# Patient Record
Sex: Male | Born: 1985 | Race: Black or African American | Hispanic: No | Marital: Married | Smoking: Current every day smoker
Health system: Southern US, Community
[De-identification: ages and names within clinical notes are randomized; demographics above are authoritative.]

---

## 2005-11-14 ENCOUNTER — Emergency Department: Payer: Self-pay | Admitting: Unknown Physician Specialty

## 2005-11-14 ENCOUNTER — Other Ambulatory Visit: Payer: Self-pay

## 2008-06-05 ENCOUNTER — Emergency Department: Payer: Self-pay | Admitting: Internal Medicine

## 2008-10-17 ENCOUNTER — Emergency Department: Payer: Self-pay | Admitting: Emergency Medicine

## 2009-04-15 ENCOUNTER — Emergency Department: Payer: Self-pay | Admitting: Emergency Medicine

## 2009-09-13 ENCOUNTER — Emergency Department: Payer: Self-pay | Admitting: Emergency Medicine

## 2010-01-15 ENCOUNTER — Emergency Department: Payer: Self-pay | Admitting: Internal Medicine

## 2010-09-20 ENCOUNTER — Emergency Department: Payer: Self-pay | Admitting: Emergency Medicine

## 2017-11-07 ENCOUNTER — Emergency Department (HOSPITAL_COMMUNITY)
Admission: EM | Admit: 2017-11-07 | Discharge: 2017-11-07 | Discharge status: Against Medical Advice | Payer: Self-pay | Attending: Emergency Medicine | Admitting: Emergency Medicine

## 2017-11-07 ENCOUNTER — Encounter (HOSPITAL_COMMUNITY): Payer: Self-pay | Admitting: Emergency Medicine

## 2017-11-07 ENCOUNTER — Emergency Department (HOSPITAL_COMMUNITY): Payer: Self-pay

## 2017-11-07 DIAGNOSIS — Z5321 Procedure and treatment not carried out due to patient leaving prior to being seen by health care provider: Secondary | ICD-10-CM | POA: Insufficient documentation

## 2017-11-07 DIAGNOSIS — M25571 Pain in right ankle and joints of right foot: Secondary | ICD-10-CM | POA: Insufficient documentation

## 2017-11-07 NOTE — ED Provider Notes (Signed)
Unable to find patient.  Attempted to find patient in the waiting room but he did not respond.  I did not participate in the care of this patient.  Graciella FreerLindsey Tico Crotteau, PA-C   Maxwell CaulLayden, Metta Koranda A, PA-C 11/07/17 1854    Maxwell CaulLayden, Keila Turan A, PA-C 11/07/17 Tempie [id number]    Cardama, Pedro Eduardo, MD 11/09/17 1140
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
## 2018-04-01 ENCOUNTER — Emergency Department (HOSPITAL_BASED_OUTPATIENT_CLINIC_OR_DEPARTMENT_OTHER)
Admission: EM | Admit: 2018-04-01 | Discharge: 2018-04-01 | Disposition: A | Discharge status: Home / Self Care | Payer: No Typology Code available for payment source | Attending: Emergency Medicine | Admitting: Emergency Medicine

## 2018-04-01 ENCOUNTER — Encounter (HOSPITAL_BASED_OUTPATIENT_CLINIC_OR_DEPARTMENT_OTHER): Payer: Self-pay

## 2018-04-01 ENCOUNTER — Other Ambulatory Visit: Payer: Self-pay

## 2018-04-01 DIAGNOSIS — F17200 Nicotine dependence, unspecified, uncomplicated: Secondary | ICD-10-CM | POA: Diagnosis not present

## 2018-04-01 DIAGNOSIS — M25532 Pain in left wrist: Secondary | ICD-10-CM | POA: Insufficient documentation

## 2018-04-01 DIAGNOSIS — M25562 Pain in left knee: Secondary | ICD-10-CM | POA: Diagnosis not present

## 2018-04-01 DIAGNOSIS — Y998 Other external cause status: Secondary | ICD-10-CM | POA: Insufficient documentation

## 2018-04-01 DIAGNOSIS — Y939 Activity, unspecified: Secondary | ICD-10-CM | POA: Insufficient documentation

## 2018-04-01 DIAGNOSIS — M25571 Pain in right ankle and joints of right foot: Secondary | ICD-10-CM | POA: Diagnosis not present

## 2018-04-01 DIAGNOSIS — Y92410 Unspecified street and highway as the place of occurrence of the external cause: Secondary | ICD-10-CM | POA: Diagnosis not present
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
## 2018-04-01 NOTE — Discharge Instructions (Addendum)
Do not be surprised if you feel a little more sore tomorrow. You may use Ibuprofen/Naproxen and/or Tylenol for pain relief. You may also use heat or cold compresses for additional relief.

## 2018-07-11 ENCOUNTER — Encounter (HOSPITAL_BASED_OUTPATIENT_CLINIC_OR_DEPARTMENT_OTHER): Payer: Self-pay | Admitting: Adult Health

## 2018-07-11 ENCOUNTER — Other Ambulatory Visit: Payer: Self-pay

## 2018-07-11 ENCOUNTER — Emergency Department (HOSPITAL_BASED_OUTPATIENT_CLINIC_OR_DEPARTMENT_OTHER)
Admission: EM | Admit: 2018-07-11 | Discharge: 2018-07-11 | Disposition: A | Discharge status: Home / Self Care | Payer: Self-pay | Attending: Emergency Medicine | Admitting: Emergency Medicine

## 2018-07-11 DIAGNOSIS — F17200 Nicotine dependence, unspecified, uncomplicated: Secondary | ICD-10-CM | POA: Insufficient documentation

## 2018-07-11 DIAGNOSIS — R300 Dysuria: Secondary | ICD-10-CM | POA: Insufficient documentation

## 2018-07-11 DIAGNOSIS — F1210 Cannabis abuse, uncomplicated: Secondary | ICD-10-CM | POA: Insufficient documentation

## 2018-07-11 DIAGNOSIS — Z202 Contact with and (suspected) exposure to infections with a predominantly sexual mode of transmission: Secondary | ICD-10-CM | POA: Insufficient documentation

## 2018-07-11 MED ORDER — CEFTRIAXONE SODIUM 250 MG IJ SOLR
250.0000 mg | Freq: Once | INTRAMUSCULAR | Status: AC
  Administered 2018-07-11: 250 mg via INTRAMUSCULAR
  Filled 2018-07-11: qty 250

## 2018-07-11 MED ORDER — AZITHROMYCIN 250 MG PO TABS
1000.0000 mg | ORAL_TABLET | Freq: Once | ORAL | Status: AC
  Administered 2018-07-11: 1000 mg via ORAL
  Filled 2018-07-11: qty 4

## 2018-07-11 NOTE — ED Provider Notes (Signed)
[facility] EMERGENCY DEPARTMENT Provider Note   CSN: [id number] Arrival date & time: 07/11/18  0859     History   Chief Complaint Chief Complaint  Patient presents with  . Penile Discharge    HPI Benjamin Castro is a 32 y.o. male.  Patient is a 32 year old male with no significant past medical history.  He presents with complaints of penile discharge for the past week.  He states that his significant other was diagnosed with chlamydia 1 week ago.  She was treated, however they have continued to have sexual activity.  He does report some dysuria, but denies any abdominal pain, fevers, or chills.  The history is provided by the patient.  Penile Discharge  This is a new problem. The current episode started more than 1 week ago. The problem occurs constantly. The problem has been gradually worsening. Pertinent negatives include no abdominal pain. Exacerbated by: Urinating. Nothing relieves the symptoms. He has tried nothing for the symptoms.    History reviewed. No pertinent past medical history.  There are no active problems to display for this patient.   History reviewed. No pertinent surgical history.      Home Medications    Prior to Admission medications   Not on File    Family History History reviewed. No pertinent family history.  Social History Social History   Tobacco Use  . Smoking status: Current Every Day Smoker  . Smokeless tobacco: Never Used  Substance Use Topics  . Alcohol use: Yes    Comment: occ  . Drug use: Yes    Types: Marijuana     Allergies   Patient has no known allergies.   Review of Systems Review of Systems  Gastrointestinal: Negative for abdominal pain.  Genitourinary: Positive for discharge.  All other systems reviewed and are negative.    Physical Exam Updated Vital Signs BP 126/76   Pulse (!) 50   Temp 98.1 F (36.7 C) (Oral)   Resp 18   Ht 5\' 9"  (1.753 m)   Wt 104.3 kg   SpO2 99%   BMI 33.97 kg/m    Physical Exam  Constitutional: He is oriented to person, place, and time. He appears well-developed and well-nourished. No distress.  HENT:  Head: Normocephalic and atraumatic.  Neck: Normal range of motion.  Pulmonary/Chest: Effort normal.  Genitourinary:  Genitourinary Comments: There is some penile discharge noted.  There are no rashes or lesions.  Penis and testicles otherwise appear normal.  Neurological: He is alert and oriented to person, place, and time.  Skin: He is not diaphoretic.  Nursing note and vitals reviewed.    ED Treatments / Results  Labs (all labs ordered are listed, but only abnormal results are displayed) Labs Reviewed - No data to display  EKG None  Radiology No results found.  Procedures Procedures (including critical care time)  Medications Ordered in ED Medications  cefTRIAXone (ROCEPHIN) injection 250 mg (has no administration in time range)  azithromycin (ZITHROMAX) tablet 1,000 mg (has no administration in time range)     Initial Impression / Assessment and Plan / ED Course  I have reviewed the triage vital signs and the nursing notes.  Pertinent labs & imaging results that were available during my care of the patient were reviewed by me and considered in my medical decision making (see chart for details).  Patient will be treated with Rocephin and Zithromax.  His significant other tested positive for chlamydia 1 week ago and I see no  reason to test him.  He has been advised to abstain from unprotected sexual activity for 2 weeks.  To follow-up as needed.  Final Clinical Impressions(s) / ED Diagnoses   Final diagnoses:  None    ED Discharge Orders    None       Geoffery Lyons, MD 07/11/18 [phone]

## 2018-07-11 NOTE — ED Triage Notes (Signed)
Presents requesting STD check. The mother of his child had a positive STD test. He endorses white penile discharge, denies pain with urination.

## 2018-07-11 NOTE — Discharge Instructions (Addendum)
No unprotected sexual activity for 2 weeks.  Follow-up with your primary doctor if symptoms are not improving or worsen.

## 2018-09-17 ENCOUNTER — Emergency Department (HOSPITAL_BASED_OUTPATIENT_CLINIC_OR_DEPARTMENT_OTHER): Payer: Self-pay

## 2018-09-17 ENCOUNTER — Emergency Department (HOSPITAL_BASED_OUTPATIENT_CLINIC_OR_DEPARTMENT_OTHER)
Admission: EM | Admit: 2018-09-17 | Discharge: 2018-09-17 | Disposition: A | Discharge status: Home / Self Care | Payer: Self-pay | Attending: Emergency Medicine | Admitting: Emergency Medicine

## 2018-09-17 ENCOUNTER — Other Ambulatory Visit: Payer: Self-pay

## 2018-09-17 ENCOUNTER — Encounter (HOSPITAL_BASED_OUTPATIENT_CLINIC_OR_DEPARTMENT_OTHER): Payer: Self-pay

## 2018-09-17 DIAGNOSIS — B9789 Other viral agents as the cause of diseases classified elsewhere: Secondary | ICD-10-CM | POA: Insufficient documentation

## 2018-09-17 DIAGNOSIS — F17210 Nicotine dependence, cigarettes, uncomplicated: Secondary | ICD-10-CM | POA: Insufficient documentation

## 2018-09-17 DIAGNOSIS — J069 Acute upper respiratory infection, unspecified: Secondary | ICD-10-CM | POA: Insufficient documentation

## 2018-09-17 MED ORDER — IBUPROFEN 800 MG PO TABS
800.0000 mg | ORAL_TABLET | Freq: Once | ORAL | Status: AC
  Administered 2018-09-17: 800 mg via ORAL
  Filled 2018-09-17: qty 1

## 2018-09-17 NOTE — ED Provider Notes (Signed)
[facility] EMERGENCY DEPARTMENT Provider Note   CSN: [id number] Arrival date & time: 09/17/18  1133     History   Chief Complaint Chief Complaint  Patient presents with  . Cough    HPI Benjamin Castro is a 32 y.o. male.  32yo m who p/w flu-like symptoms. He reports 1 week of cough, nasal congestion, rhinorrhea, sore throat, body aches, sneezing, dry mouth, chest pains, and constipation. No vomiting, diarrhea, or fevers. No known sick contacts. He has tried nyquil last night with no relief.   The history is provided by the patient.    History reviewed. No pertinent past medical history.  There are no active problems to display for this patient.   History reviewed. No pertinent surgical history.      Home Medications    Prior to Admission medications   Not on File    Family History No family history on file.  Social History Social History   Tobacco Use  . Smoking status: Current Every Day Smoker  . Smokeless tobacco: Never Used  Substance Use Topics  . Alcohol use: Yes    Comment: occ  . Drug use: Not Currently    Types: Marijuana     Allergies   Patient has no known allergies.   Review of Systems Review of Systems All other systems reviewed and are negative except that which was mentioned in HPI   Physical Exam Updated Vital Signs BP (!) 146/97 (BP Location: Left Arm)   Pulse 60   Temp 98.8 F (37.1 C) (Oral)   Resp 16   Ht 5\' 9"  (1.753 m)   Wt 101.3 kg   SpO2 100%   BMI 32.98 kg/m   Physical Exam Vitals signs and nursing note reviewed.  Constitutional:      General: He is not in acute distress.    Appearance: He is well-developed.  HENT:     Head: Normocephalic and atraumatic.     Nose: Congestion present.  Eyes:     Conjunctiva/sclera: Conjunctivae normal.  Neck:     Musculoskeletal: Neck supple.  Cardiovascular:     Rate and Rhythm: Normal rate and regular rhythm.     Heart sounds: Normal heart sounds. No murmur.    Pulmonary:     Effort: Pulmonary effort is normal.     Breath sounds: Normal breath sounds.  Abdominal:     General: Bowel sounds are normal. There is no distension.     Palpations: Abdomen is soft.     Tenderness: There is no abdominal tenderness.  Skin:    General: Skin is warm and dry.  Neurological:     Mental Status: He is alert and oriented to person, place, and time.     Comments: Fluent speech  Psychiatric:        Judgment: Judgment normal.      ED Treatments / Results  Labs (all labs ordered are listed, but only abnormal results are displayed) Labs Reviewed - No data to display  EKG EKG Interpretation  Date/Time:  Wednesday September 17 2018 13:35:27 EST Ventricular Rate:  47 PR Interval:    QRS Duration: 94 QT Interval:  420 QTC Calculation: 372 R Axis:   67 Text Interpretation:  Sinus bradycardia Baseline wander in lead(s) V6 rate slower than previous Confirmed by Frederick Peers [phone]) on 09/17/2018 1:57:08 PM   Radiology Dg Chest 2 View  Result Date: 09/17/2018 CLINICAL DATA:  Flu like symptoms for the past week including cough and body  aches. Current smoker. EXAM: CHEST - 2 VIEW COMPARISON:  None. FINDINGS: The lungs are well-expanded. There is no focal infiltrate. There is no pleural effusion. The heart and pulmonary vascularity are normal. The mediastinum is normal in width. The bony thorax exhibits no acute abnormality. IMPRESSION: There is no active cardiopulmonary disease. Electronically Signed   By: David  SwazilandJordan M.D.   On: 09/17/2018 13:30    Procedures Procedures (including critical care time)  Medications Ordered in ED Medications  ibuprofen (ADVIL,MOTRIN) tablet 800 mg (800 mg Oral Given 09/17/18 1338)     Initial Impression / Assessment and Plan / ED Course  I have reviewed the triage vital signs and the nursing notes.  Pertinent imaging results that were available during my care of the patient were reviewed by me and considered in my  medical decision making (see chart for details).    Well-appearing on exam, clear breath sounds, reassuring vital signs.  Chest x-ray clear.  Symptoms are consistent with a viral illness.  Discussed supportive measures and reviewed return precautions.  He voiced understanding. Final Clinical Impressions(s) / ED Diagnoses   Final diagnoses:  Viral URI with cough    ED Discharge Orders    None       Little, Ambrose Finlandachel Morgan, MD 09/17/18 1415

## 2018-09-17 NOTE — ED Triage Notes (Signed)
C/o flu like sx x 1 week-NAD-steady gait 

## 2020-08-12 IMAGING — CR DG CHEST 2V
2 series · 2 of 2 positions shown · non-contrast
Comparison: None.

CLINICAL DATA: Flu like symptoms for the past week including cough
and body aches. Current smoker.

EXAM:
CHEST - 2 VIEW

[w chest pa]
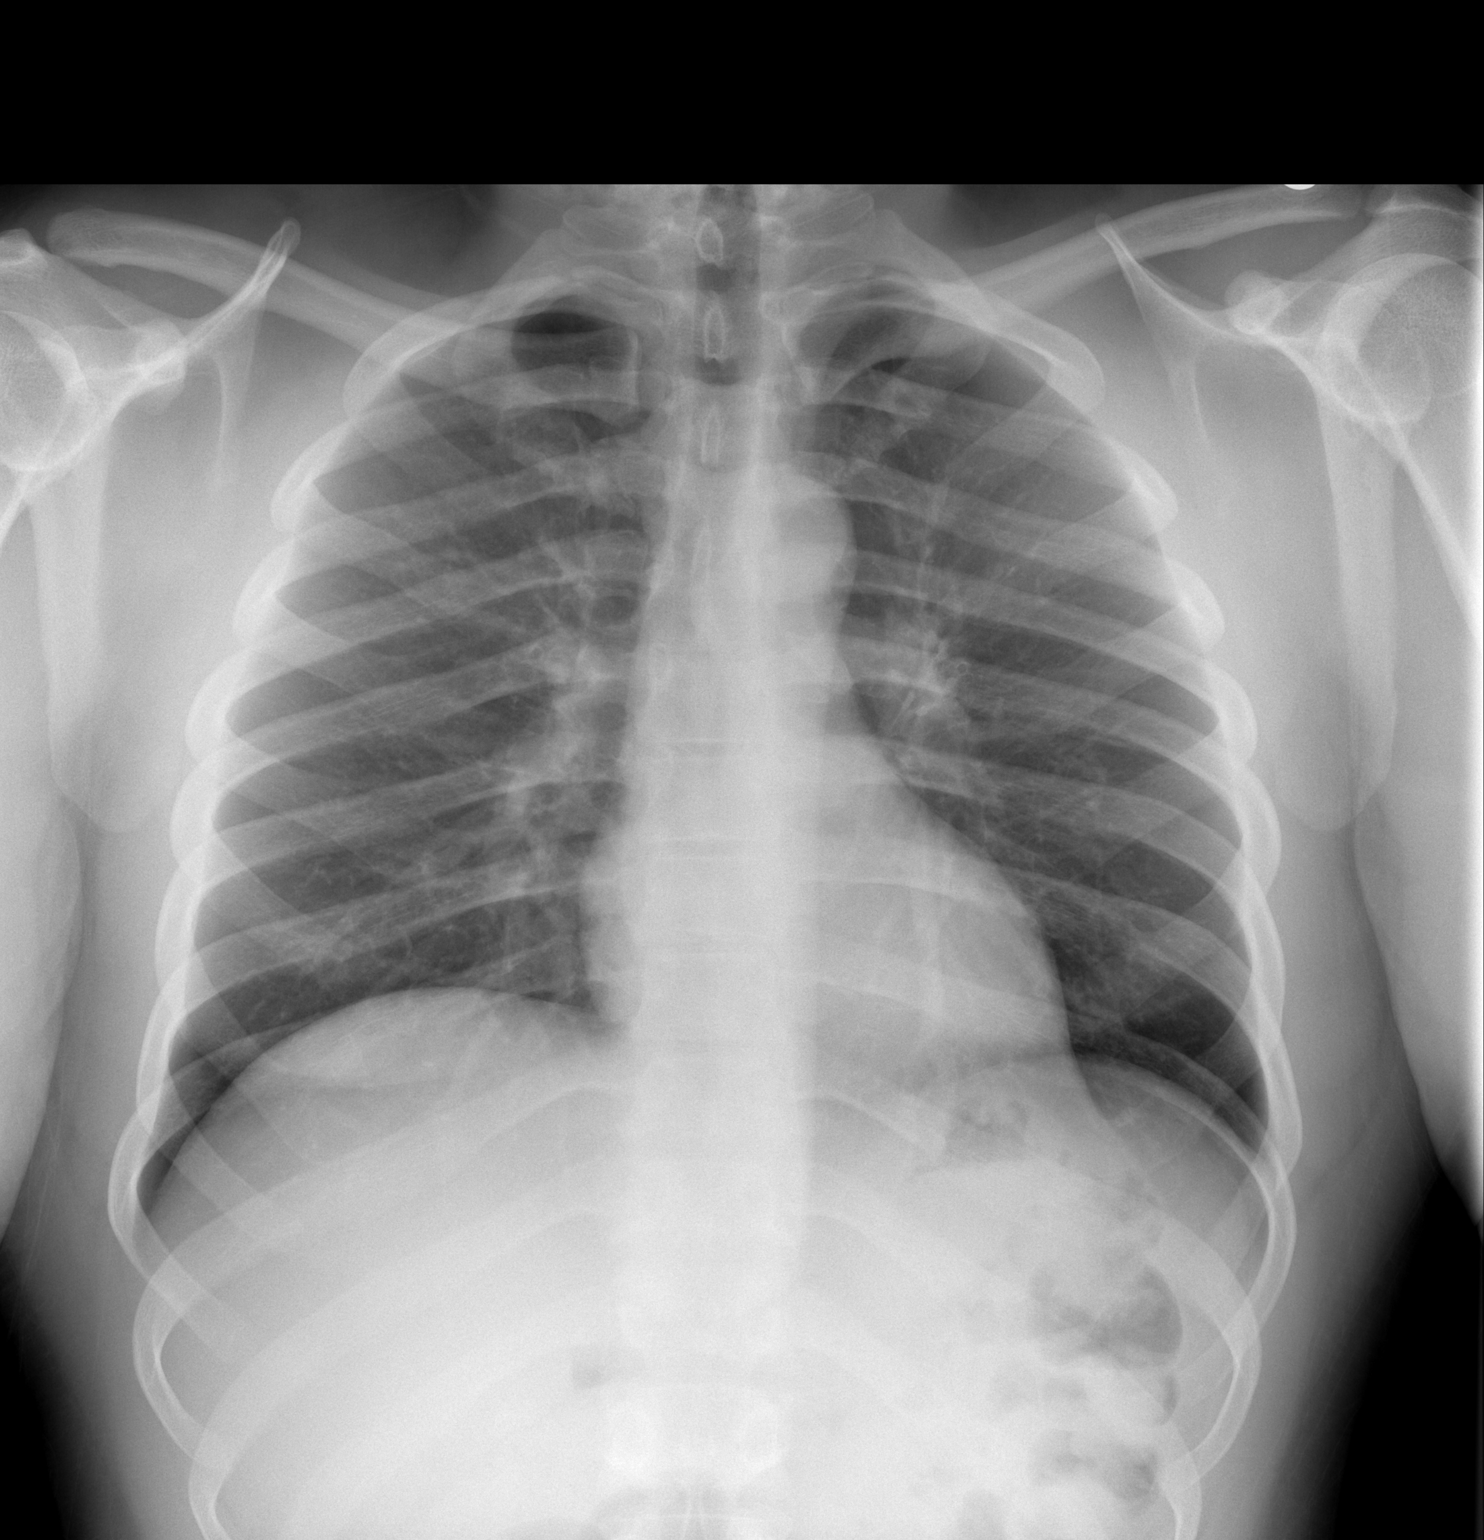

[w chest lat]
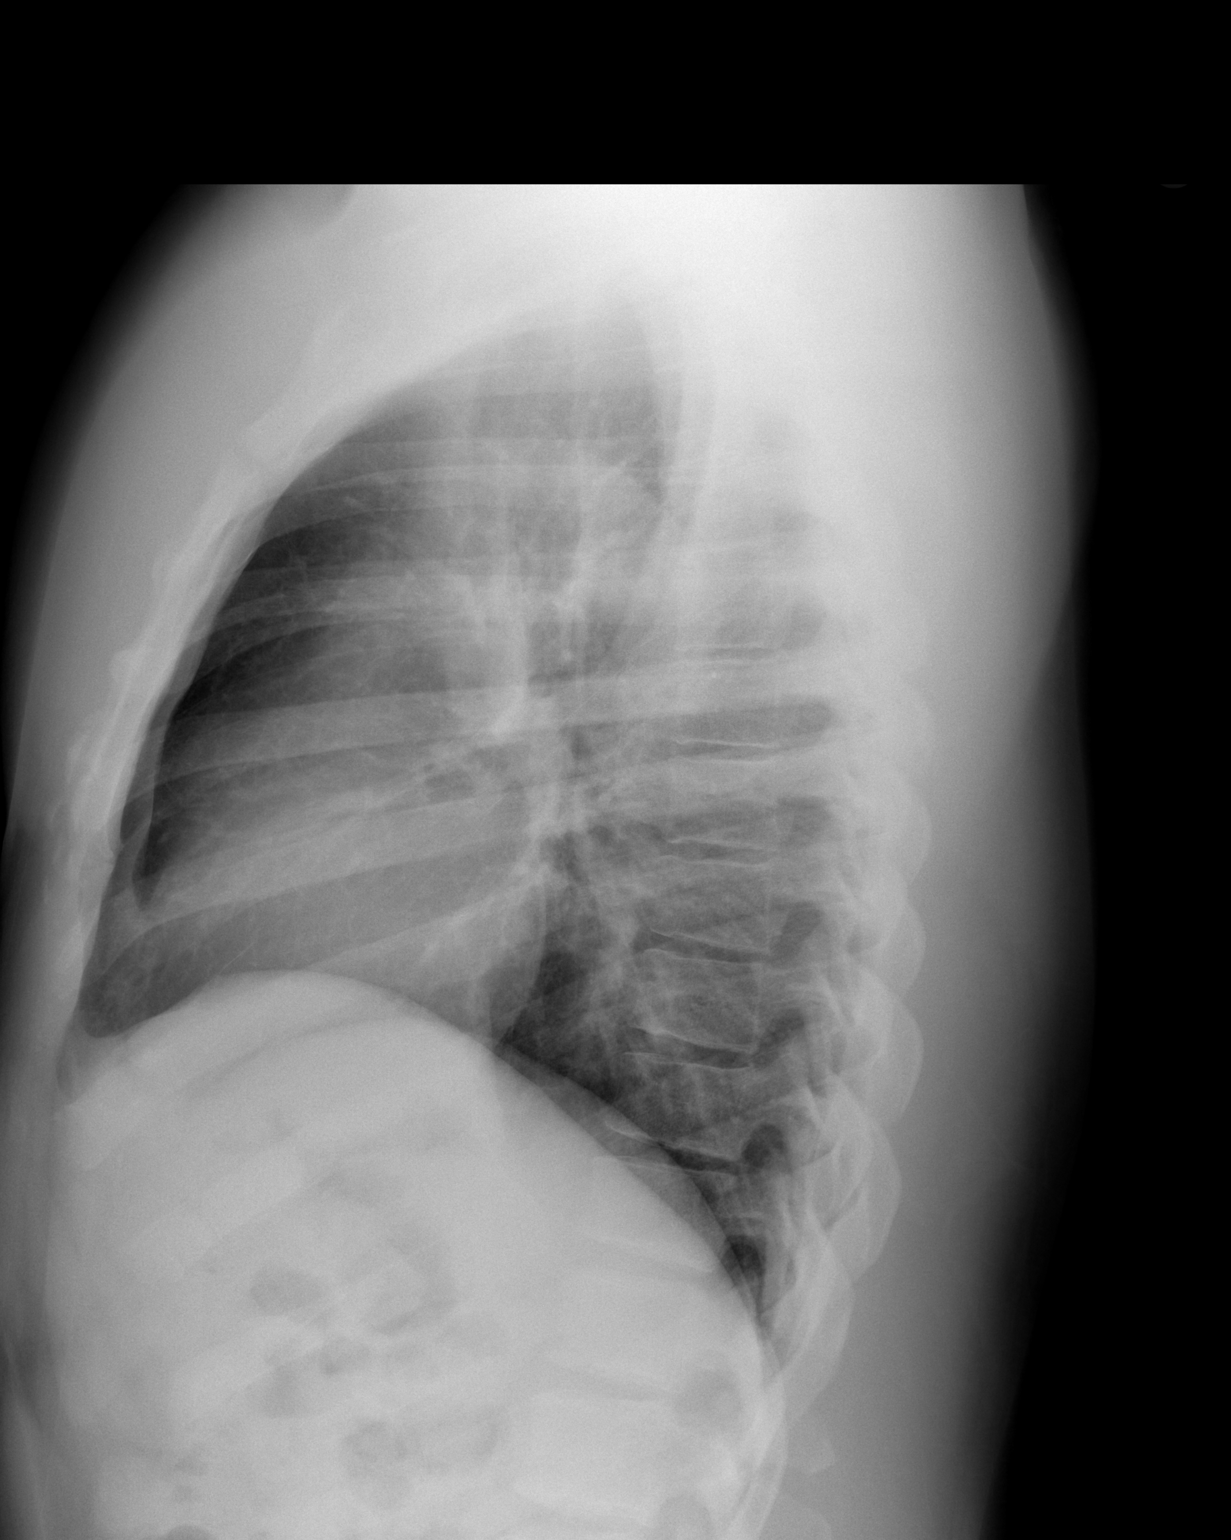

[2 of 2 positions shown; findings below may reference images not displayed]

FINDINGS: The lungs are well-expanded. There is no focal infiltrate. There is
no pleural effusion. The heart and pulmonary vascularity are normal.
The mediastinum is normal in width. The bony thorax exhibits no
acute abnormality.
IMPRESSION: There is no active cardiopulmonary disease.
